# Patient Record
Sex: Female | Born: 1965 | Race: Black or African American | Hispanic: No | Marital: Married | Smoking: Never smoker
Health system: Southern US, Community
[De-identification: ages and names within clinical notes are randomized; demographics above are authoritative.]

---

## 2000-03-10 ENCOUNTER — Encounter: Payer: Self-pay | Admitting: *Deleted

## 2000-03-10 ENCOUNTER — Ambulatory Visit (HOSPITAL_COMMUNITY): Admission: RE | Admit: 2000-03-10 | Discharge: 2000-03-10 | Payer: Self-pay | Admitting: *Deleted

## 2000-04-14 ENCOUNTER — Ambulatory Visit (HOSPITAL_COMMUNITY): Admission: RE | Admit: 2000-04-14 | Discharge: 2000-04-14 | Payer: Self-pay | Admitting: *Deleted

## 2000-08-09 ENCOUNTER — Inpatient Hospital Stay (HOSPITAL_COMMUNITY): Admission: AD | Admit: 2000-08-09 | Discharge: 2000-08-09 | Payer: Self-pay | Admitting: *Deleted

## 2000-08-09 ENCOUNTER — Inpatient Hospital Stay (HOSPITAL_COMMUNITY): Admission: AD | Admit: 2000-08-09 | Discharge: 2000-08-09 | Payer: Self-pay | Admitting: Obstetrics & Gynecology

## 2000-08-20 ENCOUNTER — Inpatient Hospital Stay (HOSPITAL_COMMUNITY): Admission: AD | Admit: 2000-08-20 | Discharge: 2000-08-23 | Payer: Self-pay | Admitting: Obstetrics & Gynecology

## 2002-01-17 ENCOUNTER — Emergency Department (HOSPITAL_COMMUNITY): Admission: EM | Admit: 2002-01-17 | Discharge: 2002-01-17 | Payer: Self-pay | Admitting: Emergency Medicine

## 2003-01-05 ENCOUNTER — Inpatient Hospital Stay (HOSPITAL_COMMUNITY): Admission: AD | Admit: 2003-01-05 | Discharge: 2003-01-06 | Payer: Self-pay | Admitting: Family Medicine

## 2003-01-06 ENCOUNTER — Encounter: Payer: Self-pay | Admitting: Family Medicine

## 2003-01-06 ENCOUNTER — Inpatient Hospital Stay (HOSPITAL_COMMUNITY): Admission: AD | Admit: 2003-01-06 | Discharge: 2003-01-06 | Payer: Self-pay | Admitting: Obstetrics & Gynecology

## 2006-07-02 ENCOUNTER — Other Ambulatory Visit: Admission: RE | Admit: 2006-07-02 | Discharge: 2006-07-02 | Payer: Self-pay | Admitting: Obstetrics and Gynecology

## 2012-01-09 ENCOUNTER — Other Ambulatory Visit: Payer: Self-pay | Admitting: Family Medicine

## 2012-01-09 ENCOUNTER — Other Ambulatory Visit (HOSPITAL_COMMUNITY)
Admission: RE | Admit: 2012-01-09 | Discharge: 2012-01-09 | Disposition: A | Discharge status: Home / Self Care | Payer: Medicaid Other | Source: Ambulatory Visit | Attending: Family Medicine | Admitting: Family Medicine

## 2012-01-09 DIAGNOSIS — Z01419 Encounter for gynecological examination (general) (routine) without abnormal findings: Secondary | ICD-10-CM | POA: Insufficient documentation

## 2014-04-11 ENCOUNTER — Other Ambulatory Visit: Payer: Self-pay

## 2014-04-11 DIAGNOSIS — Z1239 Encounter for other screening for malignant neoplasm of breast: Secondary | ICD-10-CM

## 2014-05-03 ENCOUNTER — Ambulatory Visit: Payer: Medicaid Other

## 2015-03-30 ENCOUNTER — Encounter (HOSPITAL_COMMUNITY): Payer: Self-pay | Admitting: *Deleted

## 2015-03-30 ENCOUNTER — Emergency Department (HOSPITAL_COMMUNITY): Payer: Medicaid Other

## 2015-03-30 ENCOUNTER — Emergency Department (HOSPITAL_COMMUNITY)
Admission: EM | Admit: 2015-03-30 | Discharge: 2015-03-30 | Discharge status: Against Medical Advice | Payer: Medicaid Other | Attending: Emergency Medicine | Admitting: Emergency Medicine

## 2015-03-30 DIAGNOSIS — B349 Viral infection, unspecified: Secondary | ICD-10-CM | POA: Diagnosis not present

## 2015-03-30 DIAGNOSIS — R52 Pain, unspecified: Secondary | ICD-10-CM | POA: Diagnosis present

## 2015-03-30 LAB — CBC WITH DIFFERENTIAL/PLATELET
BASOS PCT: 0 %
Basophils Absolute: 0 10*3/uL (ref 0.0–0.1)
Eosinophils Absolute: 0 10*3/uL (ref 0.0–0.7)
Eosinophils Relative: 0 %
HEMATOCRIT: 37.8 % (ref 36.0–46.0)
Hemoglobin: 12.5 g/dL (ref 12.0–15.0)
LYMPHS ABS: 1.4 10*3/uL (ref 0.7–4.0)
Lymphocytes Relative: 20 %
MCH: 27.9 pg (ref 26.0–34.0)
MCHC: 33.1 g/dL (ref 30.0–36.0)
MCV: 84.4 fL (ref 78.0–100.0)
MONO ABS: 0.8 10*3/uL (ref 0.1–1.0)
MONOS PCT: 11 %
NEUTROS ABS: 5 10*3/uL (ref 1.7–7.7)
Neutrophils Relative %: 69 %
Platelets: 169 10*3/uL (ref 150–400)
RBC: 4.48 MIL/uL (ref 3.87–5.11)
RDW: 14.8 % (ref 11.5–15.5)
WBC: 7.2 10*3/uL (ref 4.0–10.5)

## 2015-03-30 LAB — BASIC METABOLIC PANEL
Anion gap: 11 (ref 5–15)
BUN: 8 mg/dL (ref 6–20)
CALCIUM: 9.6 mg/dL (ref 8.9–10.3)
CO2: 22 mmol/L (ref 22–32)
CREATININE: 0.91 mg/dL (ref 0.44–1.00)
Chloride: 101 mmol/L (ref 101–111)
GFR calc non Af Amer: >60 mL/min (ref >60)
GLUCOSE: 110 mg/dL — AB (ref 65–99)
Potassium: 3 mmol/L — ABNORMAL LOW (ref 3.5–5.1)
Sodium: 134 mmol/L — ABNORMAL LOW (ref 135–145)

## 2015-03-30 LAB — I-STAT TROPONIN, ED: Troponin i, poc: 0.01 ng/mL (ref 0.00–0.08)

## 2015-03-30 MED ORDER — ACETAMINOPHEN 325 MG PO TABS
650.0000 mg | ORAL_TABLET | Freq: Once | ORAL | Status: AC
  Administered 2015-03-30: 650 mg via ORAL
  Filled 2015-03-30: qty 2

## 2015-03-30 NOTE — ED Notes (Signed)
The pt has generalized body aches headache productive cough pain in chest with insopirations  Since yesterday.  lmplmp none

## 2015-03-30 NOTE — Discharge Instructions (Signed)

## 2015-03-30 NOTE — ED Notes (Signed)
Patient transported to X-ray 

## 2015-03-30 NOTE — ED Provider Notes (Signed)
Patient not in room.  Felicie Morn, NP 03/30/15 1656  Tilden Fossa, MD 03/31/15 [phone]

## 2015-03-30 NOTE — ED Notes (Signed)
Pt discharged to home. Pt a&o x4. Ambulatory at dc w/ steady gait, NAD. Pt reports she has all of her belongings with her at dc. Pt refused wheelchair.

## 2015-03-30 NOTE — ED Notes (Signed)
No tylenol taken at home

## 2015-04-06 ENCOUNTER — Emergency Department (HOSPITAL_COMMUNITY): Payer: Medicaid Other

## 2015-04-06 ENCOUNTER — Encounter (HOSPITAL_COMMUNITY): Payer: Self-pay | Admitting: Family Medicine

## 2015-04-06 ENCOUNTER — Emergency Department (HOSPITAL_COMMUNITY)
Admission: EM | Admit: 2015-04-06 | Discharge: 2015-04-06 | Disposition: A | Discharge status: Home / Self Care | Payer: Medicaid Other | Attending: Emergency Medicine | Admitting: Emergency Medicine

## 2015-04-06 DIAGNOSIS — R42 Dizziness and giddiness: Secondary | ICD-10-CM | POA: Insufficient documentation

## 2015-04-06 DIAGNOSIS — Z3202 Encounter for pregnancy test, result negative: Secondary | ICD-10-CM | POA: Insufficient documentation

## 2015-04-06 DIAGNOSIS — R079 Chest pain, unspecified: Secondary | ICD-10-CM | POA: Diagnosis present

## 2015-04-06 DIAGNOSIS — E860 Dehydration: Secondary | ICD-10-CM | POA: Insufficient documentation

## 2015-04-06 LAB — BASIC METABOLIC PANEL
ANION GAP: 10 (ref 5–15)
BUN: 8 mg/dL (ref 6–20)
CO2: 29 mmol/L (ref 22–32)
Calcium: 9.5 mg/dL (ref 8.9–10.3)
Chloride: 95 mmol/L — ABNORMAL LOW (ref 101–111)
Creatinine, Ser: 0.78 mg/dL (ref 0.44–1.00)
GFR calc Af Amer: >60 mL/min (ref >60)
Glucose, Bld: 113 mg/dL — ABNORMAL HIGH (ref 65–99)
POTASSIUM: 3 mmol/L — AB (ref 3.5–5.1)
SODIUM: 134 mmol/L — AB (ref 135–145)

## 2015-04-06 LAB — URINE MICROSCOPIC-ADD ON

## 2015-04-06 LAB — CBC
HCT: 37.3 % (ref 36.0–46.0)
HEMOGLOBIN: 11.9 g/dL — AB (ref 12.0–15.0)
MCH: 27.1 pg (ref 26.0–34.0)
MCHC: 31.9 g/dL (ref 30.0–36.0)
MCV: 85 fL (ref 78.0–100.0)
PLATELETS: 155 10*3/uL (ref 150–400)
RBC: 4.39 MIL/uL (ref 3.87–5.11)
RDW: 14.9 % (ref 11.5–15.5)
WBC: 5.8 10*3/uL (ref 4.0–10.5)

## 2015-04-06 LAB — PREGNANCY, URINE: Preg Test, Ur: NEGATIVE

## 2015-04-06 LAB — URINALYSIS, ROUTINE W REFLEX MICROSCOPIC
GLUCOSE, UA: NEGATIVE mg/dL
HGB URINE DIPSTICK: NEGATIVE
Ketones, ur: 15 mg/dL — AB
Nitrite: NEGATIVE
PH: 6 (ref 5.0–8.0)
Protein, ur: NEGATIVE mg/dL
SPECIFIC GRAVITY, URINE: 1.023 (ref 1.005–1.030)
UROBILINOGEN UA: 4 mg/dL — AB (ref 0.0–1.0)

## 2015-04-06 LAB — I-STAT TROPONIN, ED: TROPONIN I, POC: 0 ng/mL (ref 0.00–0.08)

## 2015-04-06 MED ORDER — SODIUM CHLORIDE 0.9 % IV BOLUS (SEPSIS)
1500.0000 mL | Freq: Once | INTRAVENOUS | Status: AC
  Administered 2015-04-06: 1500 mL via INTRAVENOUS

## 2015-04-06 MED ORDER — POTASSIUM CHLORIDE CRYS ER 20 MEQ PO TBCR
40.0000 meq | EXTENDED_RELEASE_TABLET | Freq: Once | ORAL | Status: AC
  Administered 2015-04-06: 40 meq via ORAL
  Filled 2015-04-06: qty 2

## 2015-04-06 MED ORDER — GI COCKTAIL ~~LOC~~
30.0000 mL | Freq: Once | ORAL | Status: AC
  Administered 2015-04-06: 30 mL via ORAL
  Filled 2015-04-06: qty 30

## 2015-04-06 NOTE — ED Provider Notes (Signed)
CSN: [id number]     Arrival date & time 04/06/15  1146 History   First MD Initiated Contact with Patient 04/06/15 1502     Chief Complaint  Patient presents with  . Chest Pain  . Dizziness     HPI Patient reports mild lightheadedness and generalized weakness over the past 24 hours.  She reported nursing staff that she was having some chest discomfort.  She denies any chest pain at this time.  She feels lightheaded in the sense that the room is moving when she stands up quickly.  She reports mild anorexia without abdominal pain.  She denies chest pain and palpitations.  No dysuria or urinary frequency.  No productive cough.  No melena or hematochezia.  No history of blood transfusion.  No other complaints.  Denies unilateral leg or arm weakness   History reviewed. No pertinent past medical history. History reviewed. No pertinent past surgical history. History reviewed. No pertinent family history. Social History  Substance Use Topics  . Smoking status: Never Smoker   . Smokeless tobacco: None  . Alcohol Use: Yes   OB History    No data available     Review of Systems  All other systems reviewed and are negative.     Allergies  Review of patient's allergies indicates no known allergies.  Home Medications   Prior to Admission medications   Medication Sig Start Date End Date Taking? Authorizing Provider  ibuprofen (ADVIL,MOTRIN) 200 MG tablet Take 400 mg by mouth every 6 (six) hours as needed for mild pain.   Yes Historical Provider, MD   BP 104/57 mmHg  Pulse 76  Temp(Src) 98.1 F (36.7 C) (Oral)  Resp 18  Ht 5\' 4"  (1.626 m)  Wt 168 lb (76.204 kg)  BMI 28.82 kg/m2  SpO2 100% Physical Exam  Constitutional: She is oriented to person, place, and time. She appears well-developed and well-nourished. No distress.  HENT:  Head: Normocephalic and atraumatic.  Dry mucous membranes  Eyes: EOM are normal.  Neck: Normal range of motion.  Cardiovascular: Normal rate, regular  rhythm and normal heart sounds.   Pulmonary/Chest: Effort normal and breath sounds normal.  Abdominal: Soft. She exhibits no distension. There is no tenderness.  Musculoskeletal: Normal range of motion.  Neurological: She is alert and oriented to person, place, and time.  Skin: Skin is warm and dry.  Psychiatric: She has a normal mood and affect. Judgment normal.  Nursing note and vitals reviewed.   ED Course  Procedures (including critical care time) Labs Review Labs Reviewed  BASIC METABOLIC PANEL - Abnormal; Notable for the following:    Sodium 134 (*)    Potassium 3.0 (*)    Chloride 95 (*)    Glucose, Bld 113 (*)    All other components within normal limits  CBC - Abnormal; Notable for the following:    Hemoglobin 11.9 (*)    All other components within normal limits  URINALYSIS, ROUTINE W REFLEX MICROSCOPIC (NOT [facility])  Rosezena SensorI-STAT TROPOININ, ED    Imaging Review Dg Chest 2 View  04/06/2015  CLINICAL DATA:  49 year old female with acute chest pain for 2 weeks. EXAM: CHEST  2 VIEW COMPARISON:  03/30/2015 chest radiograph FINDINGS: The cardiomediastinal silhouette is unremarkable. There is no evidence of focal airspace disease, pulmonary edema, suspicious pulmonary nodule/mass, pleural effusion, or pneumothorax. No acute bony abnormalities are identified. IMPRESSION: No active cardiopulmonary disease. Electronically Signed   By: Harmon PierJeffrey  Hu M.D.   On: 04/06/2015 12:31  I have personally reviewed and evaluated these images and lab results as part of my medical decision-making.   EKG Interpretation   Date/Time:  Friday April 06 2015 11:53:36 EDT Ventricular Rate:  85 PR Interval:  146 QRS Duration: 80 QT Interval:  384 QTC Calculation: 456 R Axis:   76 Text Interpretation:  Normal sinus rhythm Possible Left atrial enlargement  Borderline ECG No significant change was found Confirmed by Wallace Cogliano  MD,  Caryn Bee ([id number]) on 04/06/2015 3:24:05 PM      MDM   Final  diagnoses:  None    No respiratory symptoms.  Fever 101.9 and tachycardia of 112 noted on arrival.  Patient is nontoxic-appearing.  Clinically she seems dehydrated.  IV fluids will be given.  6:40 PM Patient feels much better after IV fluids.  Pulse rate is down to 79.  She has no unilateral arm or leg weakness.  This is not a presentation of stroke.  This is likely febrile illness with dehydration.  Discharge home in good condition.  Recommended ongoing oral hydration at home.  Recommended follow-up with a primary care physician in 2-3 days if not improving.  Recommended to return to the ER for new or worsening symptoms    Azalia Bilis, MD 04/06/15 1840

## 2015-04-06 NOTE — ED Notes (Addendum)
Pt here for chest pain and dizziness. sts that she was here Saturday for chest pain. Sts she feels like the room is spinning. Pt requesting pregnancy test. sts headache and loss of appetite

## 2015-04-06 NOTE — Discharge Instructions (Signed)

## 2017-04-02 ENCOUNTER — Encounter (HOSPITAL_COMMUNITY): Payer: Self-pay

## 2017-04-02 ENCOUNTER — Emergency Department (HOSPITAL_COMMUNITY): Payer: Medicaid Other

## 2017-04-02 ENCOUNTER — Emergency Department (HOSPITAL_COMMUNITY)
Admission: EM | Admit: 2017-04-02 | Discharge: 2017-04-02 | Disposition: A | Discharge status: Home / Self Care | Payer: Medicaid Other | Attending: Emergency Medicine | Admitting: Emergency Medicine

## 2017-04-02 DIAGNOSIS — Z111 Encounter for screening for respiratory tuberculosis: Secondary | ICD-10-CM

## 2017-04-02 NOTE — Discharge Instructions (Signed)
Your chest xray was normal today. In the future, schedule an appointment with your primary care provider for screening tests.

## 2017-04-02 NOTE — ED Provider Notes (Signed)
[facility]-EMERGENCY DEPT Provider Note   CSN: [id number] Arrival date & time: 04/02/17  1224     History   Chief Complaint Chief Complaint  Patient presents with  . needs chest xray    HPI Lacey Howell is a 51 y.o. female without significant past medical history, presenting to the ED for chest x-ray to screen for tuberculosis. She states she needs a chest x-ray annually to screen for tuberculosis for her job. She states she has never had a positive PPD in the past, however states her work requires her to have a chest x-ray. She does not present with an order for the chest x-ray or any other documentation. 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 She denies fever, cough, shortness of breath, night sweats, chest pain, or any other complaints today.  The history is provided by the patient.    History reviewed. No pertinent past medical history.  There are no active problems to display for this patient.   History reviewed. No pertinent surgical history.  OB History    No data available       Home Medications    Prior to Admission medications   Medication Sig Start Date End Date Taking? Authorizing Provider  ibuprofen (ADVIL,MOTRIN) 200 MG tablet Take 400 mg by mouth every 6 (six) hours as needed for mild pain.    [provider]    Family History No family history on file.  Social History Social History  Substance Use Topics  . Smoking status: Never Smoker  . Smokeless tobacco: Not on file  . Alcohol use Yes     Allergies   Patient has no known allergies.   Review of Systems Review of Systems  Constitutional: Negative for diaphoresis and fever.  Respiratory: Negative for cough and shortness of breath.   Cardiovascular: Negative for chest pain.     Physical Exam Updated Vital Signs BP (!) 156/74 (BP Location: Right Arm)   Pulse 70   Temp 98.3 F (36.8 C) (Oral)   Resp 16   SpO2 99%    Physical Exam  Constitutional: She appears well-developed and well-nourished. No distress.  HENT:  Head: Normocephalic and atraumatic.  Eyes: Conjunctivae are normal.  Cardiovascular: Normal rate, regular rhythm, normal heart sounds and intact distal pulses.   Pulmonary/Chest: Effort normal and breath sounds normal. No respiratory distress. She has no wheezes. She has no rales.  Psychiatric: She has a normal mood and affect. Her behavior is normal.  Nursing note and vitals reviewed.    ED Treatments / Results  Labs (all labs ordered are listed, but only abnormal results are displayed) Labs Reviewed - No data to display  EKG  EKG Interpretation None       Radiology Dg Chest 2 View  Result Date: 04/02/2017 CLINICAL DATA:  Chest pain since yesterday. Evaluation for tuberculosis. EXAM: CHEST  2 VIEW COMPARISON:  04/06/2015 FINDINGS: The cardiac silhouette is slightly enlarged, accentuated by shallower lung inflation compared to the prior study. No airspace consolidation, edema, pleural effusion, or pneumothorax is identified. No acute osseous abnormality is seen. IMPRESSION: No active cardiopulmonary disease. Electronically Signed   By: Sebastian Ache M.D.   On: 04/02/2017 13:41    Procedures Procedures (including critical care time)  Medications Ordered in ED Medications - No data to display   Initial Impression / Assessment and Plan / ED Course  I have reviewed the triage vital signs and  the nursing notes.  Pertinent labs & imaging results that were available during my care of the patient were reviewed by me and considered in my medical decision making (see chart for details).     Patient presenting, requesting chest x-ray for TB screening. She states she has never had a positive PPD, however question need for CXR vs PPD. She states she has a primary care, however did not contact them for the screening. Denies any symptoms today. Lungs clear to auscultation bilaterally.  Chest x-ray negative. Discussed improper use of ED, and recommended she follow up with primary care in the future for screening tests.  Discussed results, findings, treatment and follow up. Patient advised of return precautions. Patient verbalized understanding and agreed with plan.  Final Clinical Impressions(s) / ED Diagnoses   Final diagnoses:  Screening for tuberculosis    New Prescriptions New Prescriptions   No medications on file     Rus[place], [place] N, PA-C 04/02/17 1536    Pricilla Loveless, MD 04/03/17 [phone]

## 2017-04-02 NOTE — ED Triage Notes (Signed)
Pt presents for chest xray to rule out TB for work place. Reports came here for same last year, denies CP or any complaints.

## 2017-08-25 ENCOUNTER — Other Ambulatory Visit: Payer: Self-pay | Admitting: Internal Medicine

## 2017-08-25 DIAGNOSIS — E2839 Other primary ovarian failure: Secondary | ICD-10-CM

## 2017-09-04 ENCOUNTER — Ambulatory Visit
Admission: RE | Admit: 2017-09-04 | Discharge: 2017-09-04 | Disposition: A | Discharge status: Home / Self Care | Payer: Medicaid Other | Source: Ambulatory Visit | Attending: Internal Medicine | Admitting: Internal Medicine

## 2017-09-04 DIAGNOSIS — E2839 Other primary ovarian failure: Secondary | ICD-10-CM

## 2017-12-09 ENCOUNTER — Ambulatory Visit: Payer: Medicaid Other | Admitting: Advanced Practice Midwife

## 2017-12-09 ENCOUNTER — Encounter: Payer: Self-pay | Admitting: Advanced Practice Midwife

## 2017-12-09 ENCOUNTER — Other Ambulatory Visit (HOSPITAL_COMMUNITY)
Admission: RE | Admit: 2017-12-09 | Discharge: 2017-12-09 | Disposition: A | Discharge status: Home / Self Care | Payer: Medicaid Other | Source: Ambulatory Visit | Attending: Advanced Practice Midwife | Admitting: Advanced Practice Midwife

## 2017-12-09 VITALS — BP 121/76 | HR 59 | Wt 199.2 lb

## 2017-12-09 DIAGNOSIS — Z124 Encounter for screening for malignant neoplasm of cervix: Secondary | ICD-10-CM | POA: Diagnosis present

## 2017-12-09 DIAGNOSIS — Z01419 Encounter for gynecological examination (general) (routine) without abnormal findings: Secondary | ICD-10-CM | POA: Diagnosis not present

## 2017-12-09 DIAGNOSIS — Z1239 Encounter for other screening for malignant neoplasm of breast: Secondary | ICD-10-CM

## 2017-12-09 DIAGNOSIS — B9689 Other specified bacterial agents as the cause of diseases classified elsewhere: Secondary | ICD-10-CM | POA: Insufficient documentation

## 2017-12-09 DIAGNOSIS — Z0000 Encounter for general adult medical examination without abnormal findings: Secondary | ICD-10-CM

## 2017-12-09 NOTE — Progress Notes (Signed)
Subjective:     Lacey Howell is a 52 y.o. female here for a routine exam.  Current complaints: none.  LMP 3 years ago, no bleeding since.  No Ob/Gyn concerns.  Sees [facility] for primary care, denies any chronic conditions, no HTN or DM.  Is up to date with preventive care including colonoscopy.  No recent mammogram.  .   Gynecologic History No LMP recorded. Patient is postmenopausal. Contraception: none Last Pap: 2013. Results were: normal Last mammogram: unknown. Results were: normal  Obstetric History OB History  Gravida Para Term Preterm AB Living  3         3  SAB TAB Ectopic Multiple Live Births          3    # Outcome Date GA Lbr Len/2nd Weight Sex Delivery Anes PTL Lv  3 Gravida           2 Gravida           1 Gravida              The following portions of the patient's history were reviewed and updated as appropriate: allergies, current medications, past family history, past medical history, past social history, past surgical history and problem list.  Review of Systems Pertinent items noted in HPI and remainder of comprehensive ROS otherwise negative.    Objective:  BP 121/76   Pulse (!) 59   Wt 199 lb 3.2 oz (90.4 kg)   BMI 34.19 kg/m    VS reviewed, nursing note reviewed,  Constitutional: well developed, well nourished, no distress HEENT: normocephalic CV: normal rate Pulm/chest wall: normal effort Breast Exam:  right breast normal without mass, skin or nipple changes or axillary nodes, left breast normal without mass, skin or nipple changes or axillary nodes Abdomen: soft Neuro: alert and oriented x 3 Skin: warm, dry Psych: affect normal Pelvic exam: Cervix pink, visually closed, some patches of red scattered across cervix, "strawberry cervix", moderate amount thin white malodorous discharge, vaginal walls and external genitalia normal Bimanual exam: Cervix 0/long/high, firm, anterior, neg CMT, uterus nontender, nonenlarged, adnexa without tenderness,  enlargement, or mass  Assessment/Plan:   1. Screening for cervical cancer - Cytology - PAP  2. Well woman exam with routine gynecological exam - Cytology - PAP --STD testing, yeast, and BV testing added --Recommend self breast exams, annual well woman exam, Pap according to ASCCP guidelines  3. Breast cancer screening - MM DIGITAL SCREENING BILATERAL; Future

## 2017-12-09 NOTE — Patient Instructions (Signed)
Pap Test Why am I having this test? A pap test is sometimes called a pap smear. It is a screening test that is used to check for signs of cancer of the vagina, cervix, and uterus. The test can also identify the presence of infection or precancerous changes. Your health care provider will likely recommend you have this test done on a regular basis. This test may be done:  Every 3 years, starting at age 54.  Every 5 years, in combination with testing for the presence of human papillomavirus (HPV).  More or less often depending on other medical conditions.  What kind of sample is taken? Using a small cotton swab, plastic spatula, or brush, your health care provider will collect a sample of cells from the surface of your cervix. Your cervix is the opening to your uterus, also called a womb. Secretions from the cervix and vagina may also be collected. How do I prepare for this test?  Be aware of where you are in your menstrual cycle. You may be asked to reschedule the test if you are menstruating on the day of the test.  You may need to reschedule if you have a known vaginal infection on the day of the test.  You may be asked to avoid douching or taking a bath the day before or the day of the test.  Some medicines can cause abnormal test results, such as digitalis and tetracycline. Talk with your health care provider before your test if you take one of these medicines. What do the results mean? Abnormal test results may indicate a number of health conditions. These may include:  Cancer. Although pap test results cannot be used to diagnose cancer of the cervix, vagina, or uterus, they may suggest the possibility of cancer. Further tests would be required to determine if cancer is present.  Sexually transmitted disease.  Fungal infection.  Parasite infection.  Herpes infection.  A condition causing or contributing to infertility.  It is your responsibility to obtain your test results.  Ask the lab or department performing the test when and how you will get your results. Contact your health care provider to discuss any questions you have about your results. Talk with your health care provider to discuss your results, treatment options, and if necessary, the need for more tests. Talk with your health care provider if you have any questions about your results. This information is not intended to replace advice given to you by your health care provider. Make sure you discuss any questions you have with your health care provider. Document Released: 08/30/2002 Document Revised: 02/13/2016 Document Reviewed: 10/31/2013 Elsevier Interactive Patient Education  2018 [employer]. Mammogram A mammogram is an X-ray of the breasts that is done to check for abnormal changes. This procedure can screen for and detect any changes that may suggest breast cancer. A mammogram can also identify other changes and variations in the breast, such as:  Inflammation of the breast tissue (mastitis).  An infected area that contains a collection of pus (abscess).  A fluid-filled sac (cyst).  Fibrocystic changes. This is when breast tissue becomes denser, which can make the tissue feel rope-like or uneven under the skin.  Tumors that are not cancerous (benign).  Tell a health care provider about:  Any allergies you have.  If you have breast implants.  If you have had previous breast disease, biopsy, or surgery.  If you are breastfeeding.  Any possibility that you could be pregnant, if this applies.  If you are younger than age 54.  If you have a family history of breast cancer. What are the risks? Generally, this is a safe procedure. However, problems may occur, including:  Exposure to radiation. Radiation levels are very low with this test.  The results being misinterpreted.  The need for further tests.  The inability of the mammogram to detect certain cancers.  What happens before  the procedure?  Schedule your test about 1-2 weeks after your menstrual period. This is usually when your breasts are the least tender.  If you have had a mammogram done at a different facility in the past, get the mammogram X-rays or have them sent to your current exam facility in order to compare them.  Wash your breasts and under your arms the day of the test.  Do not wear deodorants, perfumes, lotions, or powders anywhere on your body on the day of the test.  Remove any jewelry from your neck.  Wear clothes that you can change into and out of easily. What happens during the procedure?  You will undress from the waist up and put on a gown.  You will stand in front of the X-ray machine.  Each breast will be placed between two plastic or glass plates. The plates will compress your breast for a few seconds. Try to stay as relaxed as possible during the procedure. This does not cause any harm to your breasts and any discomfort you feel will be very brief.  X-rays will be taken from different angles of each breast. The procedure may vary among health care providers and hospitals. What happens after the procedure?  The mammogram will be examined by a specialist (radiologist).  You may need to repeat certain parts of the test, depending on the quality of the images. This is commonly done if the radiologist needs a better view of the breast tissue.  Ask when your test results will be ready. Make sure you get your test results.  You may resume your normal activities. This information is not intended to replace advice given to you by your health care provider. Make sure you discuss any questions you have with your health care provider. Document Released: 06/06/2000 Document Revised: 11/12/2015 Document Reviewed: 08/18/2014 Elsevier Interactive Patient Education  [employer]2018 [employer]

## 2017-12-11 LAB — CYTOLOGY - PAP
Bacterial vaginitis: POSITIVE — AB
CANDIDA VAGINITIS: NEGATIVE
CHLAMYDIA, DNA PROBE: NEGATIVE
Diagnosis: NEGATIVE
HPV (WINDOPATH): NOT DETECTED
Neisseria Gonorrhea: NEGATIVE
TRICH (WINDOWPATH): NEGATIVE

## 2018-01-01 ENCOUNTER — Ambulatory Visit: Payer: Medicaid Other

## 2018-01-01 ENCOUNTER — Ambulatory Visit
Admission: RE | Admit: 2018-01-01 | Discharge: 2018-01-01 | Disposition: A | Discharge status: Home / Self Care | Payer: Medicaid Other | Source: Ambulatory Visit | Attending: Advanced Practice Midwife | Admitting: Advanced Practice Midwife

## 2018-01-01 DIAGNOSIS — Z1239 Encounter for other screening for malignant neoplasm of breast: Secondary | ICD-10-CM

## 2018-01-14 ENCOUNTER — Other Ambulatory Visit: Payer: Self-pay | Admitting: Advanced Practice Midwife

## 2018-01-14 DIAGNOSIS — R928 Other abnormal and inconclusive findings on diagnostic imaging of breast: Secondary | ICD-10-CM

## 2018-01-21 ENCOUNTER — Ambulatory Visit: Admission: RE | Admit: 2018-01-21 | Payer: Medicaid Other | Source: Ambulatory Visit

## 2018-01-21 ENCOUNTER — Ambulatory Visit
Admission: RE | Admit: 2018-01-21 | Discharge: 2018-01-21 | Disposition: A | Discharge status: Home / Self Care | Payer: Medicaid Other | Source: Ambulatory Visit | Attending: Advanced Practice Midwife | Admitting: Advanced Practice Midwife

## 2018-01-21 ENCOUNTER — Other Ambulatory Visit: Payer: Self-pay | Admitting: Advanced Practice Midwife

## 2018-01-21 DIAGNOSIS — R928 Other abnormal and inconclusive findings on diagnostic imaging of breast: Secondary | ICD-10-CM

## 2018-01-21 DIAGNOSIS — R921 Mammographic calcification found on diagnostic imaging of breast: Secondary | ICD-10-CM

## 2018-01-22 ENCOUNTER — Other Ambulatory Visit: Payer: Self-pay | Admitting: Advanced Practice Midwife

## 2018-01-24 ENCOUNTER — Emergency Department (HOSPITAL_COMMUNITY)
Admission: EM | Admit: 2018-01-24 | Discharge: 2018-01-24 | Disposition: A | Discharge status: Home / Self Care | Payer: Medicaid Other | Attending: Emergency Medicine | Admitting: Emergency Medicine

## 2018-01-24 ENCOUNTER — Other Ambulatory Visit: Payer: Self-pay

## 2018-01-24 ENCOUNTER — Encounter (HOSPITAL_COMMUNITY): Payer: Self-pay | Admitting: Emergency Medicine

## 2018-01-24 ENCOUNTER — Emergency Department (HOSPITAL_COMMUNITY): Payer: Medicaid Other

## 2018-01-24 DIAGNOSIS — R51 Headache: Secondary | ICD-10-CM | POA: Insufficient documentation

## 2018-01-24 DIAGNOSIS — Y939 Activity, unspecified: Secondary | ICD-10-CM | POA: Diagnosis not present

## 2018-01-24 DIAGNOSIS — Y92410 Unspecified street and highway as the place of occurrence of the external cause: Secondary | ICD-10-CM | POA: Insufficient documentation

## 2018-01-24 DIAGNOSIS — M25561 Pain in right knee: Secondary | ICD-10-CM | POA: Insufficient documentation

## 2018-01-24 DIAGNOSIS — M545 Low back pain: Secondary | ICD-10-CM | POA: Diagnosis not present

## 2018-01-24 DIAGNOSIS — M25551 Pain in right hip: Secondary | ICD-10-CM | POA: Insufficient documentation

## 2018-01-24 DIAGNOSIS — R2689 Other abnormalities of gait and mobility: Secondary | ICD-10-CM | POA: Insufficient documentation

## 2018-01-24 DIAGNOSIS — Y999 Unspecified external cause status: Secondary | ICD-10-CM | POA: Diagnosis not present

## 2018-01-24 DIAGNOSIS — M546 Pain in thoracic spine: Secondary | ICD-10-CM | POA: Diagnosis not present

## 2018-01-24 DIAGNOSIS — M542 Cervicalgia: Secondary | ICD-10-CM | POA: Insufficient documentation

## 2018-01-24 DIAGNOSIS — M7918 Myalgia, other site: Secondary | ICD-10-CM

## 2018-01-24 MED ORDER — NAPROXEN 500 MG PO TABS: 500.0000 mg | ORAL_TABLET | Freq: Two times a day (BID) | ORAL | 0 refills | Status: AC

## 2018-01-24 MED ORDER — NAPROXEN 250 MG PO TABS
500.0000 mg | ORAL_TABLET | Freq: Once | ORAL | Status: AC
  Administered 2018-01-24: 500 mg via ORAL
  Filled 2018-01-24: qty 2

## 2018-01-24 MED ORDER — METHOCARBAMOL 500 MG PO TABS: 1000.0000 mg | ORAL_TABLET | Freq: Four times a day (QID) | ORAL | 0 refills | Status: AC
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
## 2018-08-16 ENCOUNTER — Other Ambulatory Visit: Payer: Medicaid Other
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
## 2023-07-27 ENCOUNTER — Ambulatory Visit
Admission: RE | Admit: 2023-07-27 | Discharge: 2023-07-27 | Disposition: A | Discharge status: Home / Self Care | Payer: Medicaid Other | Source: Ambulatory Visit | Attending: Internal Medicine | Admitting: Internal Medicine

## 2023-07-27 ENCOUNTER — Other Ambulatory Visit: Payer: Self-pay | Admitting: Internal Medicine

## 2023-07-27 DIAGNOSIS — Z1231 Encounter for screening mammogram for malignant neoplasm of breast: Secondary | ICD-10-CM
